# Patient Record
Sex: Male | Born: 1995 | Race: Black or African American | Hispanic: No | Marital: Single | Smoking: Former smoker
Health system: Southern US, Community
[De-identification: ages and names within clinical notes are randomized; demographics above are authoritative.]

---

## 2017-11-27 ENCOUNTER — Encounter (HOSPITAL_COMMUNITY): Payer: Self-pay | Admitting: *Deleted

## 2017-11-27 ENCOUNTER — Other Ambulatory Visit: Payer: Self-pay

## 2017-11-27 ENCOUNTER — Emergency Department (HOSPITAL_COMMUNITY): Payer: Self-pay

## 2017-11-27 ENCOUNTER — Emergency Department (HOSPITAL_COMMUNITY)
Admission: EM | Admit: 2017-11-27 | Discharge: 2017-11-27 | Disposition: A | Discharge status: Home / Self Care | Payer: Self-pay | Attending: Emergency Medicine | Admitting: Emergency Medicine

## 2017-11-27 DIAGNOSIS — Y999 Unspecified external cause status: Secondary | ICD-10-CM | POA: Insufficient documentation

## 2017-11-27 DIAGNOSIS — S80811A Abrasion, right lower leg, initial encounter: Secondary | ICD-10-CM | POA: Insufficient documentation

## 2017-11-27 DIAGNOSIS — Y9339 Activity, other involving climbing, rappelling and jumping off: Secondary | ICD-10-CM | POA: Insufficient documentation

## 2017-11-27 DIAGNOSIS — X500XXA Overexertion from strenuous movement or load, initial encounter: Secondary | ICD-10-CM | POA: Insufficient documentation

## 2017-11-27 DIAGNOSIS — S93401A Sprain of unspecified ligament of right ankle, initial encounter: Secondary | ICD-10-CM

## 2017-11-27 DIAGNOSIS — Y9289 Other specified places as the place of occurrence of the external cause: Secondary | ICD-10-CM | POA: Insufficient documentation

## 2017-11-27 DIAGNOSIS — Z23 Encounter for immunization: Secondary | ICD-10-CM | POA: Insufficient documentation

## 2017-11-27 MED ORDER — TETANUS-DIPHTH-ACELL PERTUSSIS 5-2.5-18.5 LF-MCG/0.5 IM SUSP
0.5000 mL | Freq: Once | INTRAMUSCULAR | Status: AC
  Administered 2017-11-27: 0.5 mL via INTRAMUSCULAR
  Filled 2017-11-27: qty 0.5
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
## 2017-11-27 NOTE — Discharge Instructions (Signed)
You may alternate Tylenol 1000 mg every 6 hours as needed for pain and Ibuprofen 800 mg every 8 hours as needed for pain.  Please take Ibuprofen with food. ° °
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
## 2019-11-21 IMAGING — CR DG ANKLE COMPLETE 3+V*R*
3 series · 3 of 3 positions shown · non-contrast
Comparison: None.

CLINICAL DATA: Pain after fall

EXAM:
RIGHT ANKLE - COMPLETE 3+ VIEW

[ankle ap]
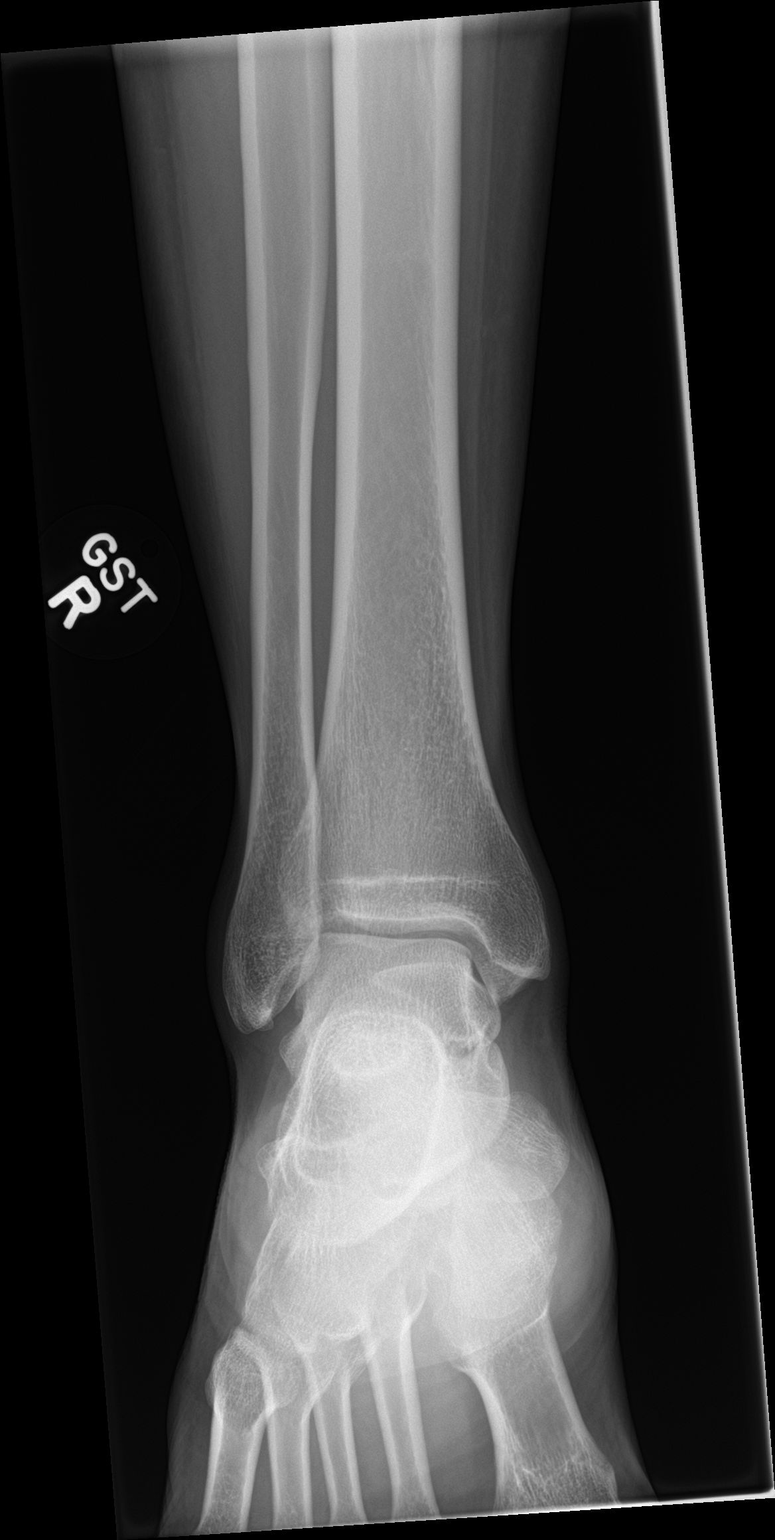

[ankle obl]
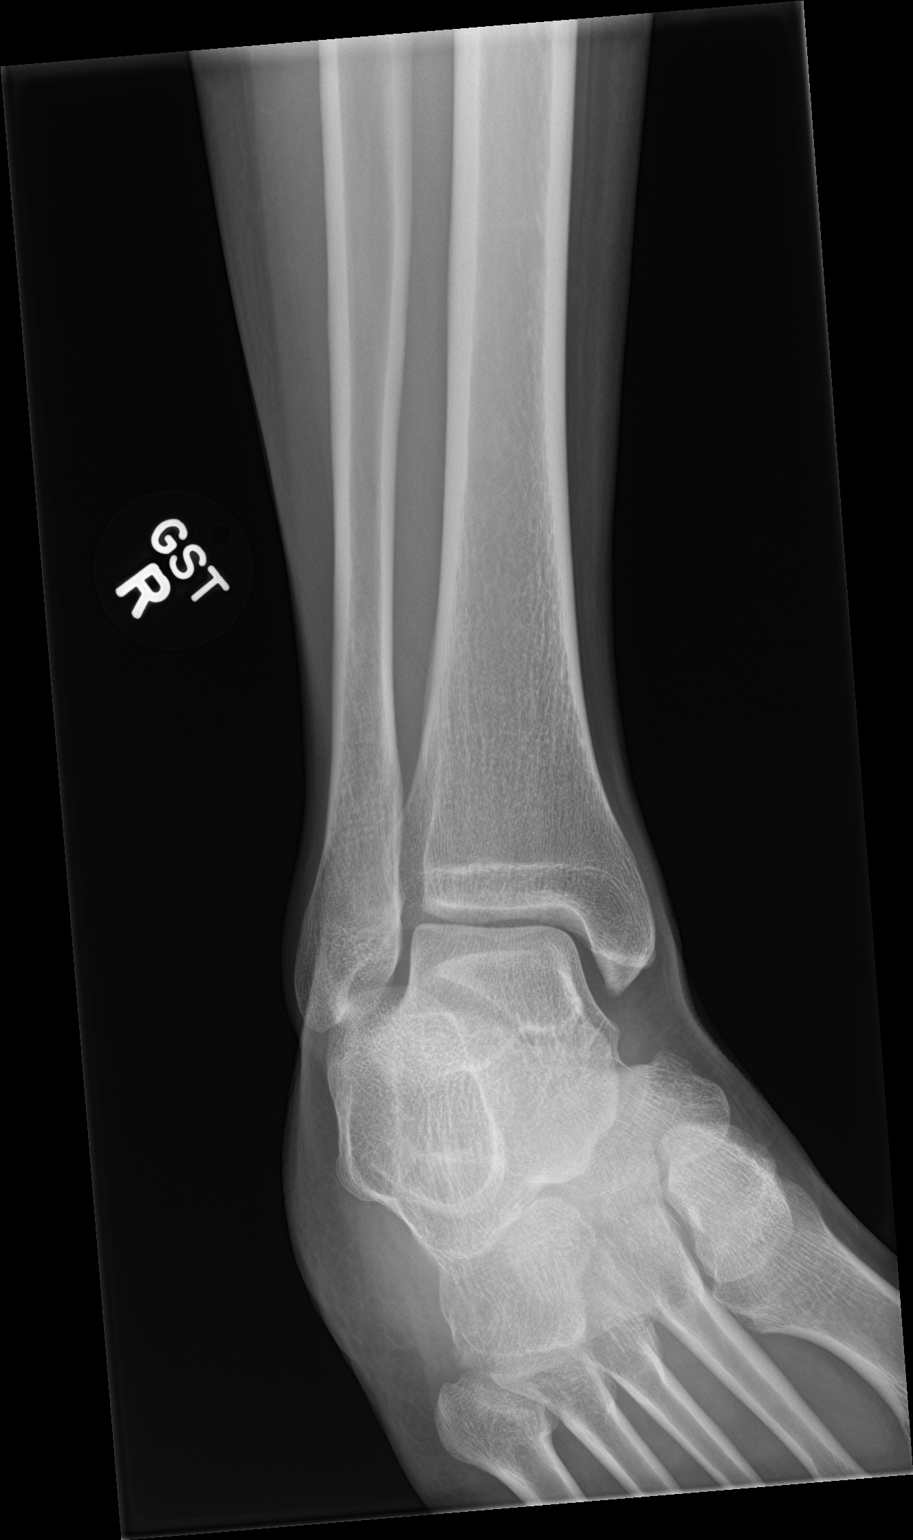

[ankle lat]
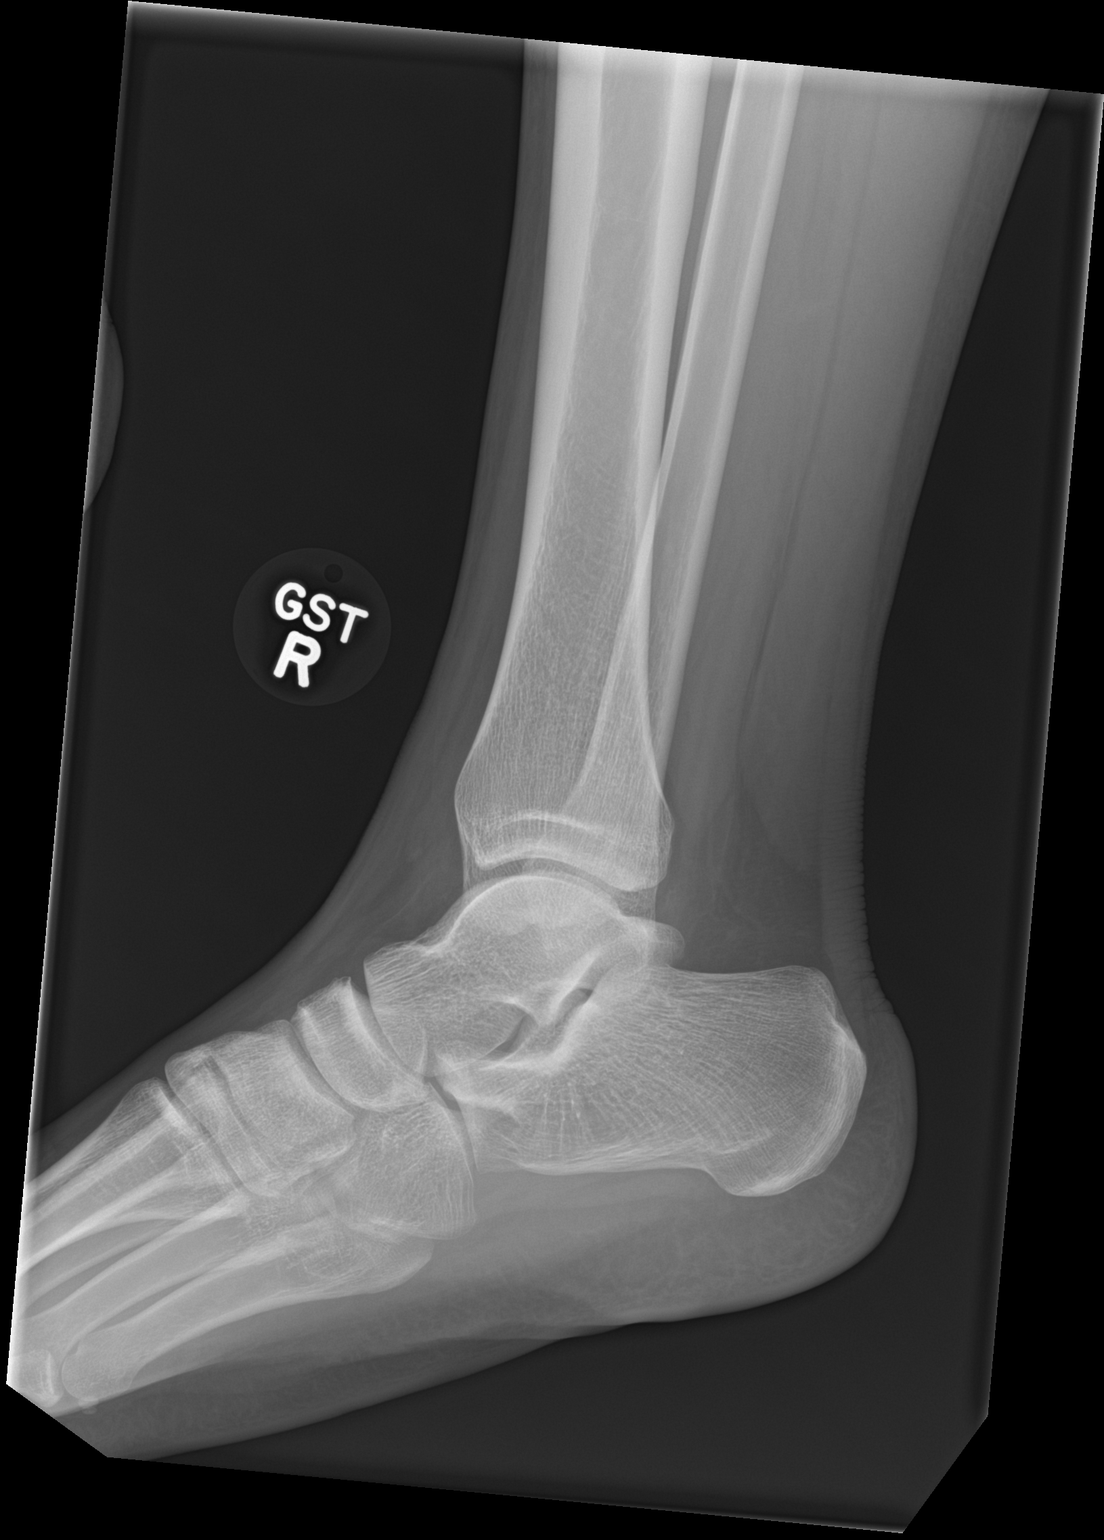

[3 of 3 positions shown; findings below may reference images not displayed]

FINDINGS: No fracture seen in the ankle.  The ankle mortise is intact.
IMPRESSION: No ankle fractures.

## 2022-07-09 ENCOUNTER — Emergency Department: Payer: Auto Insurance (includes no fault)

## 2022-07-09 ENCOUNTER — Emergency Department
Admission: EM | Admit: 2022-07-09 | Discharge: 2022-07-09 | Disposition: A | Discharge status: Home / Self Care | Payer: Auto Insurance (includes no fault) | Attending: Emergency Medicine | Admitting: Emergency Medicine

## 2022-07-09 DIAGNOSIS — S9304XA Dislocation of right ankle joint, initial encounter: Secondary | ICD-10-CM | POA: Insufficient documentation

## 2022-07-09 MED ORDER — NALOXONE HCL 4 MG/0.1ML NA LIQD
NASAL | 0 refills | Status: AC
Start: 2022-07-09 — End: ?

## 2022-07-09 MED ORDER — MIDAZOLAM HCL 1 MG/ML IJ SOLN (WRAP)
2.0000 mg | Freq: Once | INTRAMUSCULAR | Status: AC
Start: 2022-07-09 — End: 2022-07-09
  Administered 2022-07-09: 2 mg via INTRAVENOUS
  Filled 2022-07-09: qty 2

## 2022-07-09 MED ORDER — TRAMADOL HCL 50 MG PO TABS
50.0000 mg | ORAL_TABLET | Freq: Four times a day (QID) | ORAL | 0 refills | Status: AC | PRN
Start: 2022-07-09 — End: 2022-07-16

## 2022-07-09 MED ORDER — FENTANYL CITRATE (PF) 50 MCG/ML IJ SOLN (WRAP)
INTRAMUSCULAR | Status: DC
Start: 2022-07-09 — End: 2022-07-09
  Filled 2022-07-09: qty 2
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
## 2022-07-09 NOTE — ED Notes (Signed)
Bed: N 25  Expected date: 07/09/22  Expected time: 7:15 AM  Means of arrival: [id number] - Karmen Bongo  Comments:

## 2022-07-09 NOTE — Discharge Instructions (Signed)
Dear Raymond Cannon:    Thank you for choosing the [facility] Emergency Department, the premier emergency department in the [place] area.  I hope your visit today was EXCELLENT. You will receive a survey via text message that will give you the opportunity to provide feedback to your team about your visit. Please do not hesitate to reach out with any questions!    Specific instructions for your visit today:      IF YOU DO NOT CONTINUE TO IMPROVE OR YOUR CONDITION WORSENS, PLEASE CONTACT YOUR DOCTOR OR RETURN IMMEDIATELY TO THE EMERGENCY DEPARTMENT.    Sincerely,  Lind Covert*  Attending Emergency Physician  [facility] Emergency Department      OBTAINING A PRIMARY CARE APPOINTMENT    Primary care physicians (PCPs, also known as primary care doctors) are either internists or family medicine doctors. Both types of PCPs focus on health promotion, disease prevention, patient education and counseling, and treatment of acute and chronic medical conditions.    If you need a primary care doctor, please call the below number and ask who is receiving new patients.     [facility]  Telephone:  [phone]  [email or web address]    DOCTOR REFERRALS  Call [phone] (available 24 hours a day, 7 days a week) if you need any further referrals and we can help you find a primary care doctor or specialist.  Also, available online at:  [email or web address]    YOUR CONTACT INFORMATION  Before leaving please check with registration to make sure we have an up-to-date contact number.  You can call registration at [phone] to update your information.  For questions about your hospital bill, please call [phone].  For questions about your Emergency Dept Physician bill please call [phone].      FREE HEALTH SERVICES  If you need help with health or social services, please call 2-1-1 for a free referral to resources in your area.  2-1-1 is a free service connecting people  with information on health insurance, free clinics, pregnancy, mental health, dental care, food assistance, housing, and substance abuse counseling.  Also, available online at:  http://www.[place].org    ORTHOPEDIC INJURY   Please know that significant injuries can exist even when an initial x-ray is read as normal or negative.  This can occur because some fractures (broken bones) are not initially visible on x-rays.  For this reason, close outpatient follow-up with your primary care doctor or bone specialist (orthopedist) is required.    MEDICATIONS AND FOLLOWUP  Please be aware that some prescription medications can cause drowsiness.  Use caution when driving or operating machinery.    The examination and treatment you have received in our Emergency Department is provided on an emergency basis, and is not intended to be a substitute for your primary care physician.  It is important that your doctor checks you again and that you report any new or remaining problems at that time.      ASSISTANCE WITH INSURANCE    Affordable Care Act  [facility])  Call to start or finish an application, compare plans, enroll or ask a question.  [phone]  TTY: [phone]  Web:  Healthcare.gov    Help Enrolling in [facility]  [employer] [place]  808-348-8507 (TOLL-FREE)  423-359-9477 (TTY)  Web:  Http://www.coverva.org    Local Help Enrolling in the [facility]  [place] [facility]  [phone] (MAIN)  Email:  health-help@nvfs .org  Web:  BlackjackMyths.is  Address:  [phone]  [address] [place], [place] [address]    SEDATING MEDICATIONS  Sedating medications include strong pain medications (e.g. narcotics), muscle relaxers, benzodiazepines (used for anxiety and as muscle relaxers), Benadryl/diphenhydramine and other antihistamines for allergic reactions/itching, and other medications.  If you are unsure if you have received a sedating medication, please ask your physician or nurse.  If you received a sedating  medication: DO NOT drive a car. DO NOT operate machinery. DO NOT perform jobs where you need to be alert.  DO NOT drink alcoholic beverages while taking this medicine.     If you get dizzy, sit or lie down at the first signs. Be careful going up and down stairs.  Be extra careful to prevent falls.     Never give this medicine to others.     Keep this medicine out of reach of children.     Do not take or save old medicines. Throw them away when outdated.     Keep all medicines in a cool, dry place. DO NOT keep them in your bathroom medicine cabinet or in a cabinet above the stove.    MEDICATION REFILLS  Please be aware that we cannot refill any prescriptions through the ER. If you need further treatment from what is provided at your ER visit, please follow up with your primary care doctor or your pain management specialist.    [place]  Did you know [facility] has two freestanding ERs located just a few miles away?  [facility] [place] have short wait times, easy free parking directly in front of the building and top patient satisfaction scores - and the same Board Certified Emergency Medicine doctors as [facility].
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
## 2022-07-11 ENCOUNTER — Telehealth (INDEPENDENT_AMBULATORY_CARE_PROVIDER_SITE_OTHER): Payer: Self-pay

## 2022-07-11 NOTE — Telephone Encounter (Signed)
Contacted patient to inform them an Berkley Harvey is needed for specialty visits prior to being seen - unable to complete dial.    Contacted patient's spouse, aware appt will be cancelled if no auth received prior to appt.

## 2022-07-12 ENCOUNTER — Encounter (INDEPENDENT_AMBULATORY_CARE_PROVIDER_SITE_OTHER): Payer: TRICARE Prime—HMO | Admitting: Orthopaedic Surgery

## 2022-07-14 ENCOUNTER — Emergency Department (HOSPITAL_COMMUNITY)
Admission: EM | Admit: 2022-07-14 | Discharge: 2022-07-14 | Disposition: A | Discharge status: Home / Self Care | Attending: Emergency Medicine | Admitting: Emergency Medicine

## 2022-07-14 ENCOUNTER — Emergency Department (HOSPITAL_COMMUNITY)

## 2022-07-14 ENCOUNTER — Encounter (HOSPITAL_COMMUNITY): Payer: Self-pay

## 2022-07-14 ENCOUNTER — Other Ambulatory Visit: Payer: Self-pay

## 2022-07-14 DIAGNOSIS — Y92410 Unspecified street and highway as the place of occurrence of the external cause: Secondary | ICD-10-CM | POA: Insufficient documentation

## 2022-07-14 DIAGNOSIS — S99911A Unspecified injury of right ankle, initial encounter: Secondary | ICD-10-CM | POA: Diagnosis present

## 2022-07-14 DIAGNOSIS — S92101A Unspecified fracture of right talus, initial encounter for closed fracture: Secondary | ICD-10-CM | POA: Diagnosis not present

## 2022-07-14 MED ORDER — OXYCODONE-ACETAMINOPHEN 5-325 MG PO TABS: 1.0000 | ORAL_TABLET | Freq: Four times a day (QID) | ORAL | 0 refills | Status: DC | PRN

## 2022-07-14 MED ORDER — OXYCODONE-ACETAMINOPHEN 5-325 MG PO TABS
2.0000 | ORAL_TABLET | Freq: Once | ORAL | Status: AC
  Administered 2022-07-14: 2 via ORAL
  Filled 2022-07-14: qty 2
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
## 2022-07-14 NOTE — ED Notes (Signed)
Dc instructions and scripts reviewed with pt no questions or concerns at this time.  

## 2022-07-14 NOTE — Discharge Instructions (Addendum)
Given evaluated for your ankle injury.  You have a 1.2 cm fragment in your talus that would likely need orthopedic management and possibly surgery.  Please do not bear any weight.  Call and follow-up with orthopedic doctor on Monday for outpatient management.  Take opiate pain medication as prescribed but be aware it can cause some drowsiness.
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
## 2022-07-16 ENCOUNTER — Ambulatory Visit (HOSPITAL_COMMUNITY)
Admission: RE | Admit: 2022-07-16 | Discharge: 2022-07-16 | Disposition: A | Discharge status: Home / Self Care | Source: Ambulatory Visit | Attending: Physician Assistant | Admitting: Physician Assistant

## 2022-07-16 ENCOUNTER — Encounter (HOSPITAL_COMMUNITY): Payer: Self-pay

## 2022-07-16 VITALS — BP 143/88 | HR 90 | Temp 98.6°F | Resp 16

## 2022-07-16 DIAGNOSIS — S92101A Unspecified fracture of right talus, initial encounter for closed fracture: Secondary | ICD-10-CM

## 2022-07-16 MED ORDER — OXYCODONE-ACETAMINOPHEN 5-325 MG PO TABS: 1.0000 | ORAL_TABLET | Freq: Four times a day (QID) | ORAL | 0 refills | Status: AC | PRN
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
## 2022-07-16 NOTE — Discharge Instructions (Signed)
Keep follow up with orthopedics on Wednesday.  Keep foot elevated.  Take ibuprofen as needed Can take Oxycodone for severe pain every 6 hours as needed.
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
## 2022-07-18 ENCOUNTER — Encounter (HOSPITAL_COMMUNITY): Payer: Self-pay

## 2022-07-18 ENCOUNTER — Ambulatory Visit (HOSPITAL_COMMUNITY)
Admission: RE | Admit: 2022-07-18 | Discharge: 2022-07-18 | Disposition: A | Discharge status: Home / Self Care | Source: Ambulatory Visit | Attending: Emergency Medicine | Admitting: Emergency Medicine

## 2022-07-18 VITALS — BP 99/65 | HR 86 | Temp 98.9°F | Resp 15

## 2022-07-18 DIAGNOSIS — S92101S Unspecified fracture of right talus, sequela: Secondary | ICD-10-CM

## 2022-07-18 MED ORDER — IBUPROFEN 800 MG PO TABS: 800.0000 mg | ORAL_TABLET | Freq: Three times a day (TID) | ORAL | 0 refills | Status: AC

## 2022-07-18 MED ORDER — KETOROLAC TROMETHAMINE 30 MG/ML IJ SOLN
30.0000 mg | Freq: Once | INTRAMUSCULAR | Status: AC
  Administered 2022-07-18: 30 mg via INTRAMUSCULAR

## 2022-07-18 MED ORDER — KETOROLAC TROMETHAMINE 30 MG/ML IJ SOLN
INTRAMUSCULAR | Status: AC
  Filled 2022-07-18: qty 1

## 2022-07-18 NOTE — Discharge Instructions (Addendum)
We have given you an injection of Toradol in clinic today for your pain.  You can take 500 mg of Tylenol for pain persist throughout the day.  Starting tomorrow you can alternate between 800 mg of ibuprofen and 500 mg of Tylenol every 4-6 hours.  Please keep the weight off of your foot, use the crutches, and keep your foot elevated when possible.  If you are unable to get into [facility] sooner, you can consider reaching out to [facility], information attached.  Please return to clinic for any new or urgent symptoms.
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
## 2022-07-27 ENCOUNTER — Other Ambulatory Visit: Payer: Self-pay | Admitting: Orthopedic Surgery

## 2022-07-27 DIAGNOSIS — S92121A Displaced fracture of body of right talus, initial encounter for closed fracture: Secondary | ICD-10-CM

## 2022-07-30 ENCOUNTER — Ambulatory Visit
Admission: RE | Admit: 2022-07-30 | Discharge: 2022-07-30 | Disposition: A | Discharge status: Home / Self Care | Source: Ambulatory Visit | Attending: Orthopedic Surgery | Admitting: Orthopedic Surgery

## 2022-07-30 DIAGNOSIS — S92121A Displaced fracture of body of right talus, initial encounter for closed fracture: Secondary | ICD-10-CM
